# Patient Record
Sex: Female | Born: 1995 | Race: White | Hispanic: No | Marital: Single | Smoking: Current some day smoker
Health system: Southern US, Community
[De-identification: ages and names within clinical notes are randomized; demographics above are authoritative.]

## PROBLEM LIST (undated history)

## (undated) DIAGNOSIS — J302 Other seasonal allergic rhinitis: Secondary | ICD-10-CM

## (undated) DIAGNOSIS — J45909 Unspecified asthma, uncomplicated: Secondary | ICD-10-CM

## (undated) DIAGNOSIS — E282 Polycystic ovarian syndrome: Secondary | ICD-10-CM

## (undated) HISTORY — PX: UPPER GI ENDOSCOPY: SHX6162

## (undated) HISTORY — PX: OTHER SURGICAL HISTORY: SHX169

## (undated) HISTORY — PX: WISDOM TOOTH EXTRACTION: SHX21

---
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
## 2014-04-13 IMAGING — US US ABDOMEN COMPLETE
1 series · 14 of 25 positions shown · non-contrast
Comparison: None.

CLINICAL DATA: Abdominal pain. History of gallbladder sludge.

EXAM:
ABDOMEN ULTRASOUND

[Series 1: us abdomen complete · 0.28mm/px · 14 of 72 slices shown]
[im 1/72]
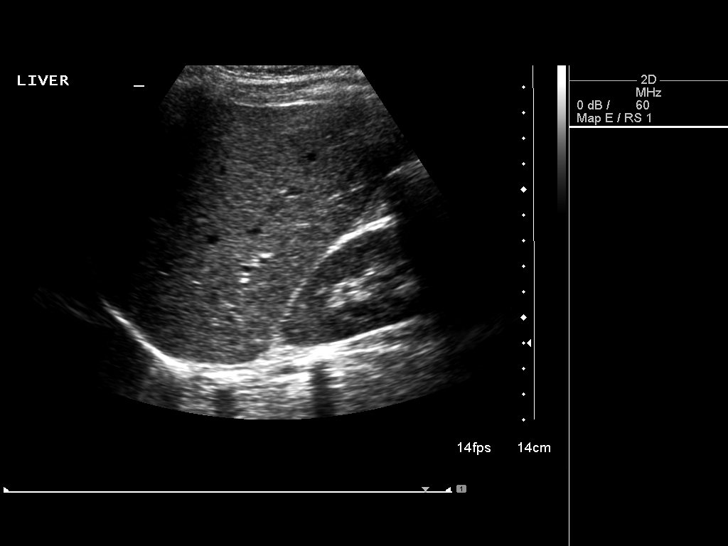
[im 6/72]
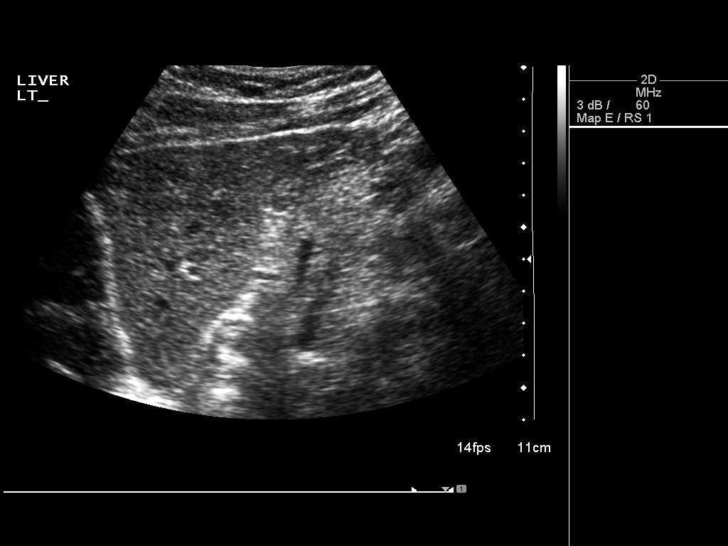
[im 12/72]
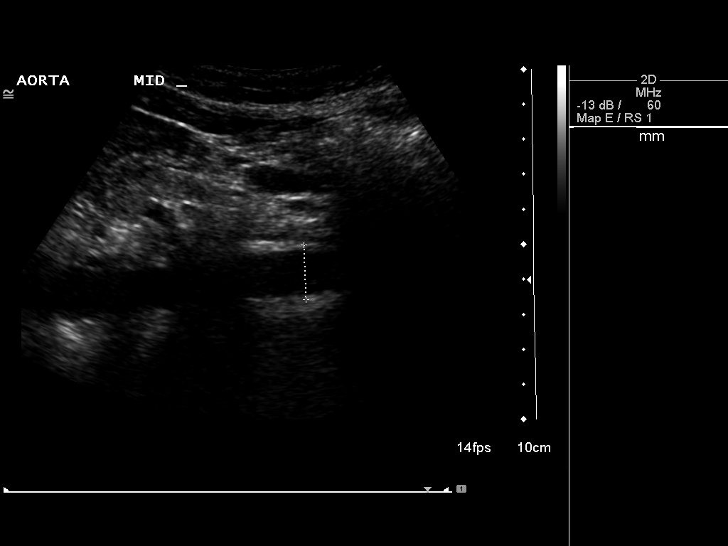
[im 18/72]
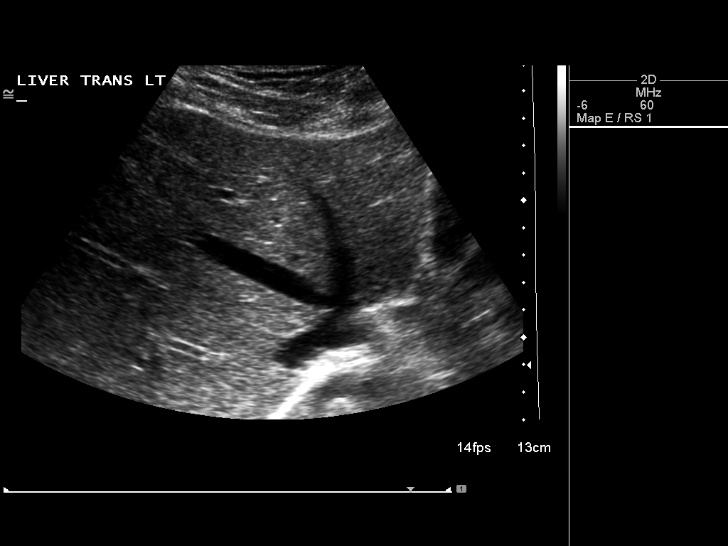
[im 24/72]
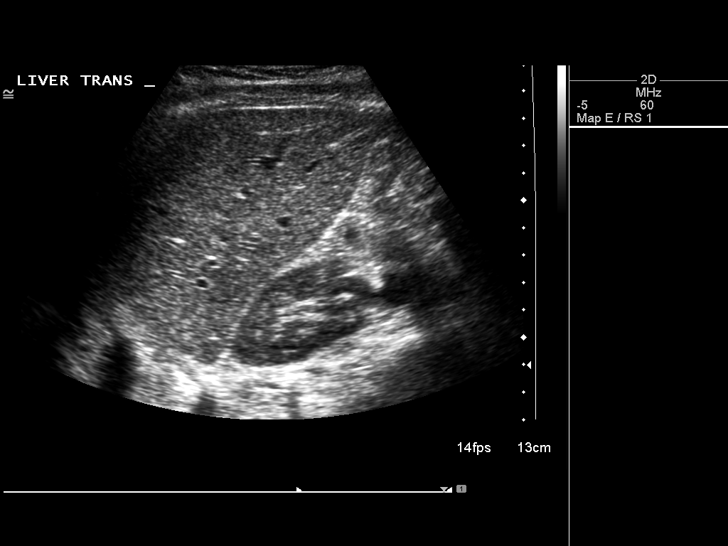
[im 27/72]
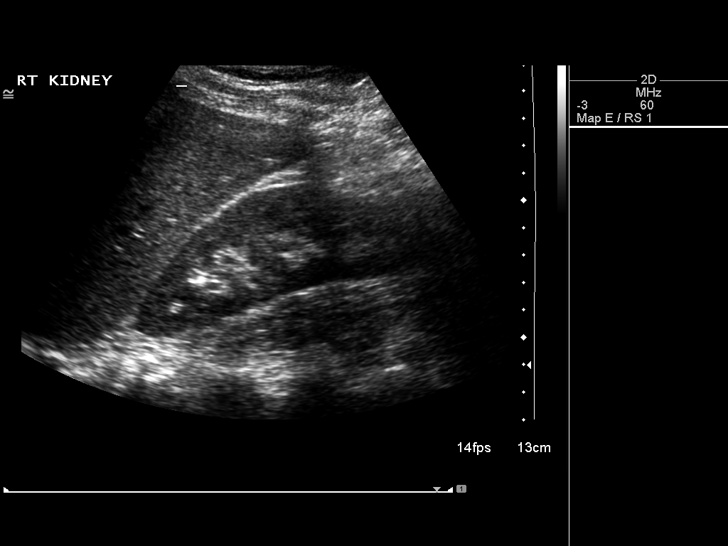
[im 33/72]
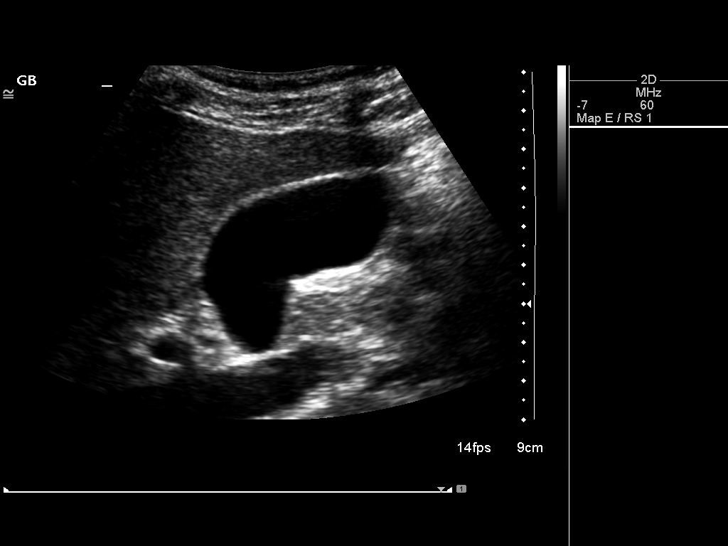
[im 39/72]
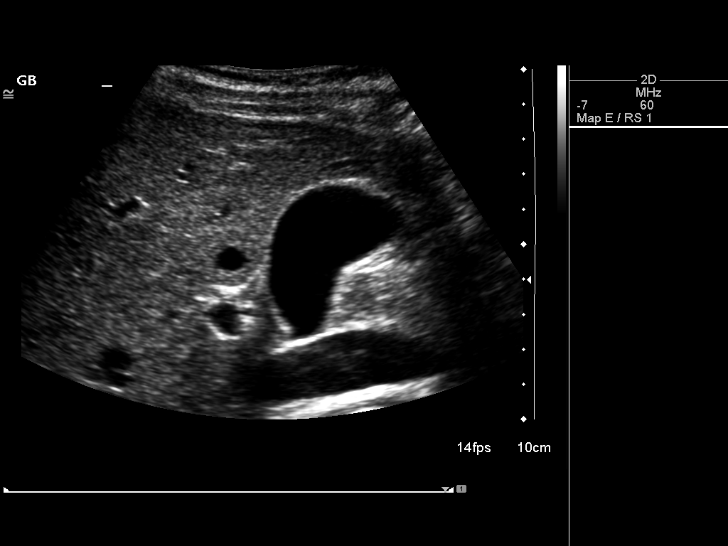
[im 45/72]
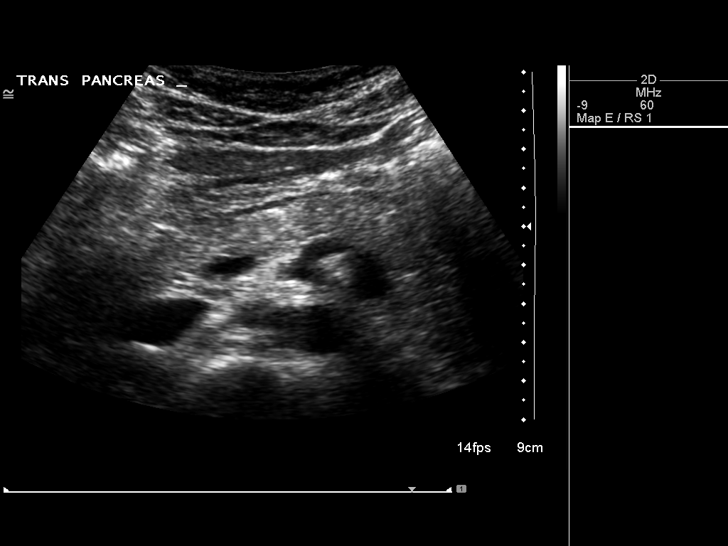
[im 48/72]
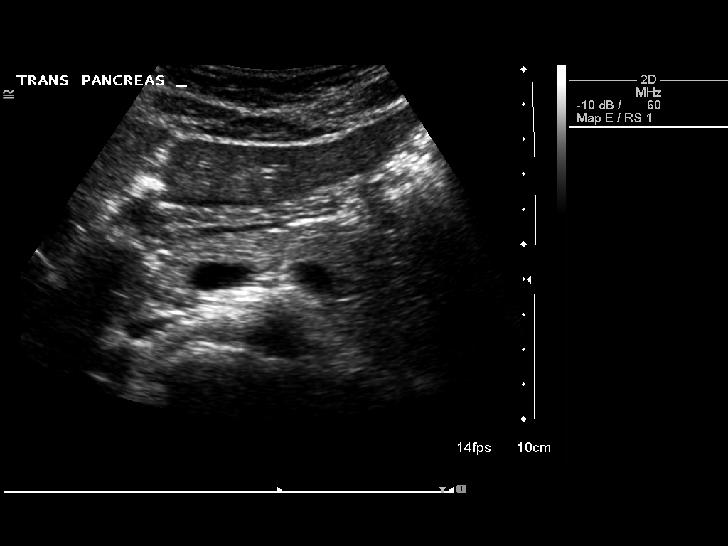
[im 54/72]
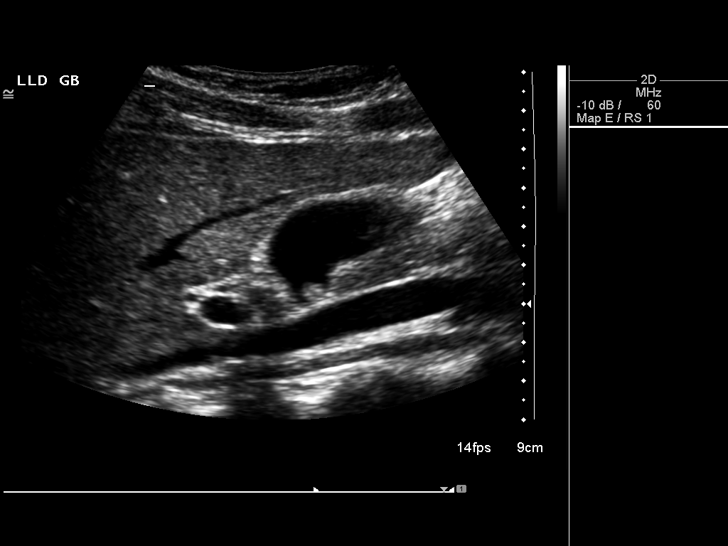
[im 60/72]
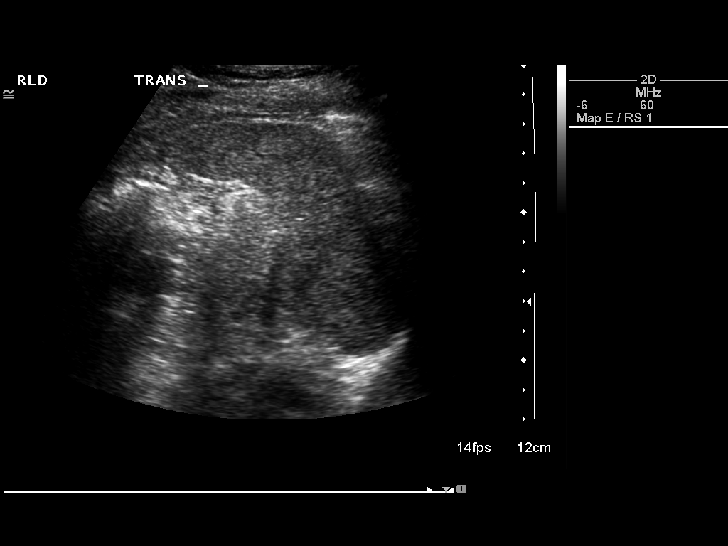
[im 66/72]
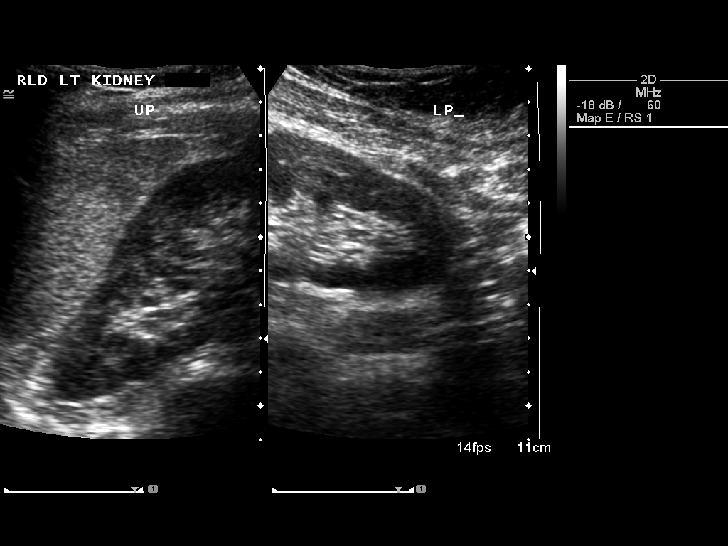
[im 72/72]
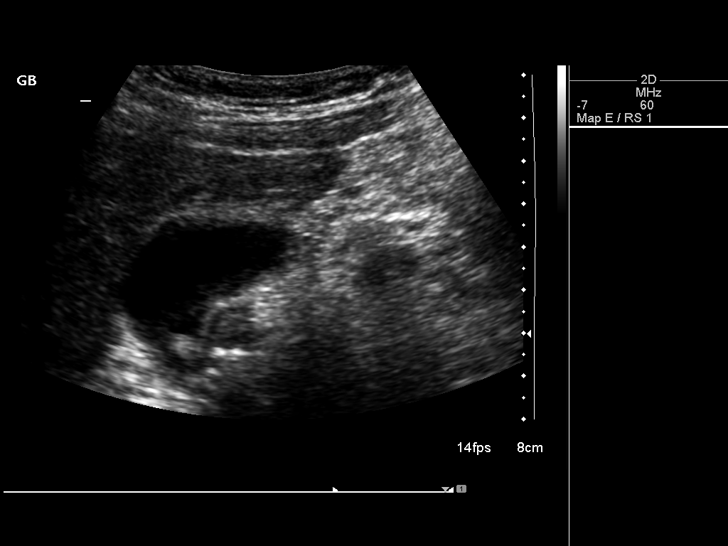

[14 of 25 positions shown; findings below may reference images not displayed]

FINDINGS: Gallbladder

There is a 6.5 mm gallbladder polyp. No gallstone, sludge or wall
thickening is identified. Sonographic Murphy sign is absent.

Common bile duct

Diameter: 2.4 mm. No intraductal calculi.

Liver

No focal lesion identified. Within normal limits in parenchymal
echogenicity.

IVC

No abnormality visualized.

Pancreas

Visualized portion unremarkable.

Spleen

Size and appearance within normal limits.

Right Kidney

Length: 11.7 cm. Echogenicity within normal limits. No mass or
hydronephrosis visualized.

Left Kidney

Length: 12.4 cm. Echogenicity within normal limits. No mass or
hydronephrosis visualized.

Abdominal aorta

No aneurysm visualized.
IMPRESSION: 1. Solitary 7 mm gallbladder polyp. Correlation with prior imaging
studies suggested to assess stability. If prior studies are
unavailable, a limited right upper quadrant ultrasound follow up
examination in 6 months is suggested to assess stability.
2. No gallstones, wall thickening or biliary dilatation.
3. Otherwise unremarkable abdominal ultrasound.

## 2014-09-03 ENCOUNTER — Emergency Department (HOSPITAL_COMMUNITY): Payer: BLUE CROSS/BLUE SHIELD

## 2014-09-03 ENCOUNTER — Encounter (HOSPITAL_COMMUNITY): Payer: Self-pay | Admitting: *Deleted

## 2014-09-03 ENCOUNTER — Emergency Department (HOSPITAL_COMMUNITY)
Admission: EM | Admit: 2014-09-03 | Discharge: 2014-09-03 | Disposition: A | Discharge status: Home / Self Care | Payer: BLUE CROSS/BLUE SHIELD | Attending: Emergency Medicine | Admitting: Emergency Medicine

## 2014-09-03 DIAGNOSIS — S6991XA Unspecified injury of right wrist, hand and finger(s), initial encounter: Secondary | ICD-10-CM | POA: Diagnosis present

## 2014-09-03 DIAGNOSIS — Y998 Other external cause status: Secondary | ICD-10-CM | POA: Diagnosis not present

## 2014-09-03 DIAGNOSIS — W230XXA Caught, crushed, jammed, or pinched between moving objects, initial encounter: Secondary | ICD-10-CM | POA: Insufficient documentation

## 2014-09-03 DIAGNOSIS — S62666A Nondisplaced fracture of distal phalanx of right little finger, initial encounter for closed fracture: Secondary | ICD-10-CM | POA: Diagnosis not present

## 2014-09-03 DIAGNOSIS — Y9389 Activity, other specified: Secondary | ICD-10-CM | POA: Diagnosis not present

## 2014-09-03 DIAGNOSIS — Z8639 Personal history of other endocrine, nutritional and metabolic disease: Secondary | ICD-10-CM | POA: Diagnosis not present

## 2014-09-03 DIAGNOSIS — Y9289 Other specified places as the place of occurrence of the external cause: Secondary | ICD-10-CM | POA: Insufficient documentation

## 2014-09-03 DIAGNOSIS — S62609A Fracture of unspecified phalanx of unspecified finger, initial encounter for closed fracture: Secondary | ICD-10-CM

## 2014-09-03 DIAGNOSIS — J45909 Unspecified asthma, uncomplicated: Secondary | ICD-10-CM | POA: Diagnosis not present

## 2014-09-03 HISTORY — DX: Polycystic ovarian syndrome: E28.2

## 2014-09-03 HISTORY — DX: Other seasonal allergic rhinitis: J30.2

## 2014-09-03 HISTORY — DX: Unspecified asthma, uncomplicated: J45.909

## 2014-09-03 MED ORDER — TRAMADOL HCL 50 MG PO TABS: 50.0000 mg | ORAL_TABLET | Freq: Four times a day (QID) | ORAL | Status: AC | PRN

## 2014-09-03 NOTE — ED Provider Notes (Signed)
CSN: [id number]     Arrival date & time 09/03/14  1731 History  This chart was scribed for non-physician practitioner, Langston Masker, PA-C, working with Tilden Fossa, MD, by Ronney Lion, ED Scribe. This patient was seen in room TR08C/TR08C and the patient's care was started at 6:18 PM.    Chief Complaint  Patient presents with  . Finger Injury   The history is provided by the patient. No language interpreter was used.    HPI Comments: Caroline Wright is a 19 y.o. female who presents to the Emergency Department complaining of right pinky pain, bleeding, and swelling after she closed her finger in a car door earlier today. She states immediately after injuring it, her pinky turned dark in color and began to swell. She states she think it may be broken. Patient denies a history of chronic medical conditions. She has known allergies to Lortab.   Past Medical History  Diagnosis Date  . Asthma   . Polycystic ovarian syndrome   . Seasonal allergies    Past Surgical History  Procedure Laterality Date  . Wisdom tooth extraction    . Upper gi endoscopy    . Colonscopy     No family history on file. History  Substance Use Topics  . Smoking status: Not on file  . Smokeless tobacco: Not on file  . Alcohol Use: Not on file   OB History    No data available     Review of Systems  Musculoskeletal: Positive for myalgias (right pinky pain and swelling).  Skin: Positive for color change and wound.  All other systems reviewed and are negative.  Allergies  Lortab  Home Medications   Prior to Admission medications   Not on File   BP 121/69 mmHg  Pulse 63  Temp(Src) 98.2 F (36.8 C) (Oral)  Resp 16  Ht  (1.626 m)  Wt 139 lb (63.05 kg)  BMI 23.85 kg/m2  SpO2 100% Physical Exam  Constitutional: She is oriented to person, place, and time. She appears well-developed and well-nourished. No distress.  HENT:  Head: Normocephalic and atraumatic.  Eyes: Conjunctivae and EOM are normal.   Neck: Neck supple. No tracheal deviation present.  Cardiovascular: Normal rate.   Pulmonary/Chest: Effort normal. No respiratory distress.  Musculoskeletal: Normal range of motion.  Neurological: She is alert and oriented to person, place, and time.  Skin: Skin is warm and dry.  Swollen, bruised distal right fifth finger with slight abrasion at the base of the nail.  Psychiatric: She has a normal mood and affect. Her behavior is normal.  Nursing note and vitals reviewed.   ED Course  Procedures (including critical care time)  DIAGNOSTIC STUDIES: Oxygen Saturation is 100% on RA, normal by my interpretation.    COORDINATION OF CARE: 6:22 PM - Discussed treatment plan with pt at bedside which includes right hand XR to r/o fracture, and pt agreed to plan.  Imaging Review Dg Finger Little Right  09/03/2014   CLINICAL DATA:  Car door injury to right little finger today with pain and swelling. Initial encounter.  EXAM: RIGHT LITTLE FINGER 2+V  COMPARISON:  None.  FINDINGS: A nondisplaced oblique fracture of the mid-distal aspect of the little finger distal phalanx is noted.  There is no evidence of subluxation or dislocation.  Associated soft tissue swelling is noted.  IMPRESSION: Nondisplaced oblique fracture of the mid-distal aspect of the little finger distal phalanx.   Electronically Signed   By: Henrietta Hoover.D.  On: 09/03/2014 18:34   7:08 PM - XR results reviewed with pt. Discussed treatment plan with pt which includes placement in a finger splint, Rx pain medications as needed, and f/u in 1 week with referred hand specialist. Pt verbalized understanding and agreed to plan.    MDM   Final diagnoses:  Fracture of phalanx of finger, closed, initial encounter    Finger splint Tramadol Follow up with Hand surgery for recheck in 1 week Janee Morn  I personally performed the services in this documentation, which was scribed in my presence.  The recorded information has been reviewed  and considered.   Barnet Pall.   Elson Areas, PA-C 09/03/14 2003  Tilden Fossa, MD 09/04/14 [phone]

## 2014-09-03 NOTE — Discharge Instructions (Signed)

## 2014-09-03 NOTE — ED Notes (Signed)
Pt presents via POV after a right pinky finger injury.  Pt closed finger in the car door.  Small amount of blood noted, small cut noted, bleeding controlled.  Pt able to move finger, sensation intact.  Pt a x 4, NAD.

## 2016-02-21 IMAGING — DX DG FINGER LITTLE 2+V*R*
3 series · 3 of 3 positions shown · non-contrast
Comparison: None.

CLINICAL DATA: Car door injury to right little finger today with
pain and swelling. Initial encounter.

EXAM:
RIGHT LITTLE FINGER 2+V

[finger ap]
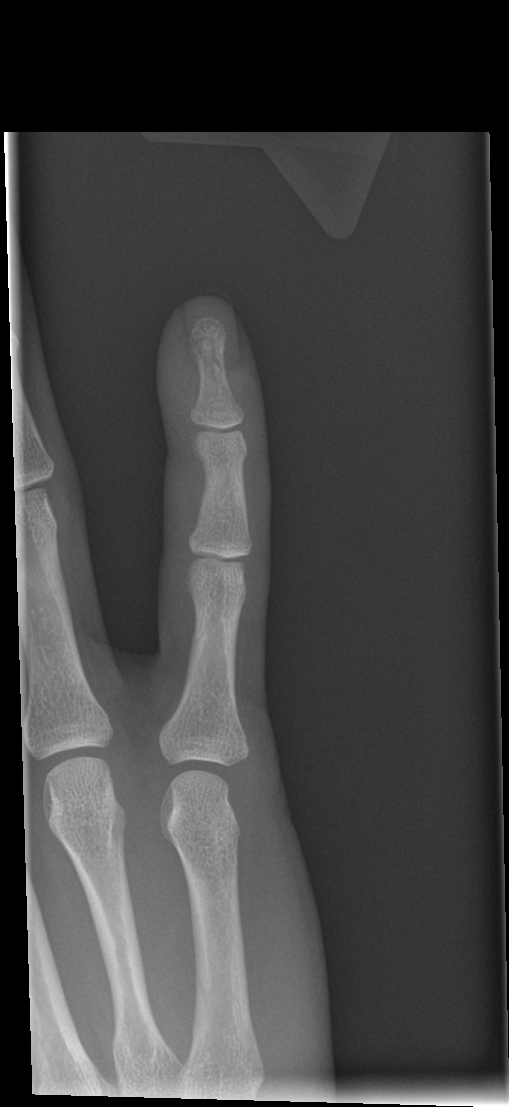

[finger obl]
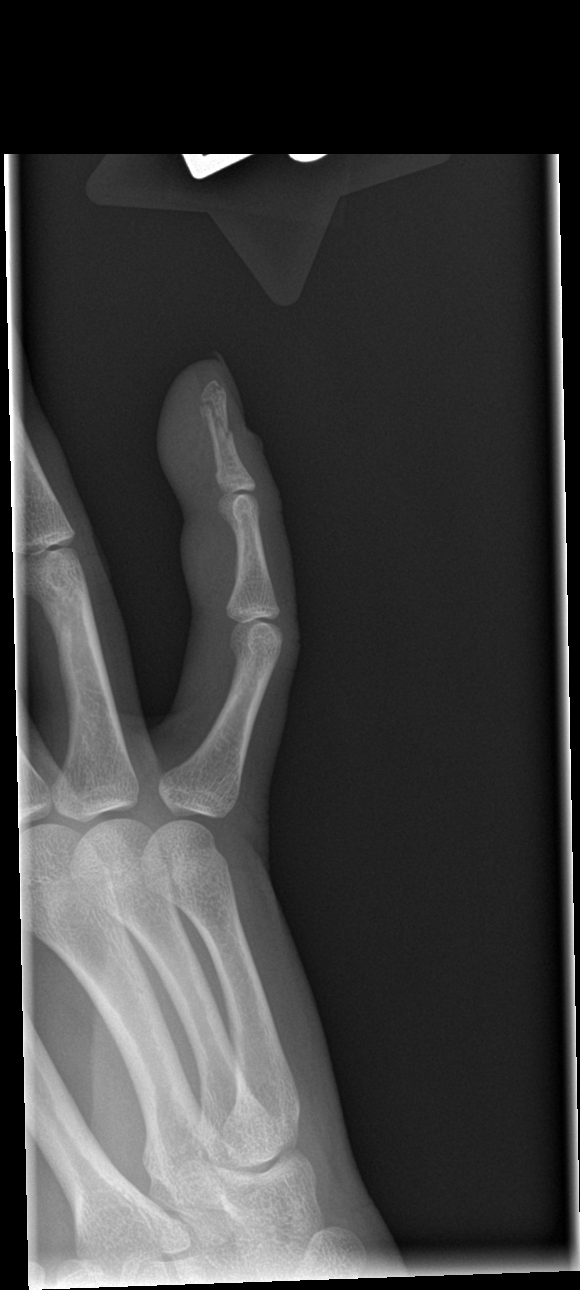

[finger lat]
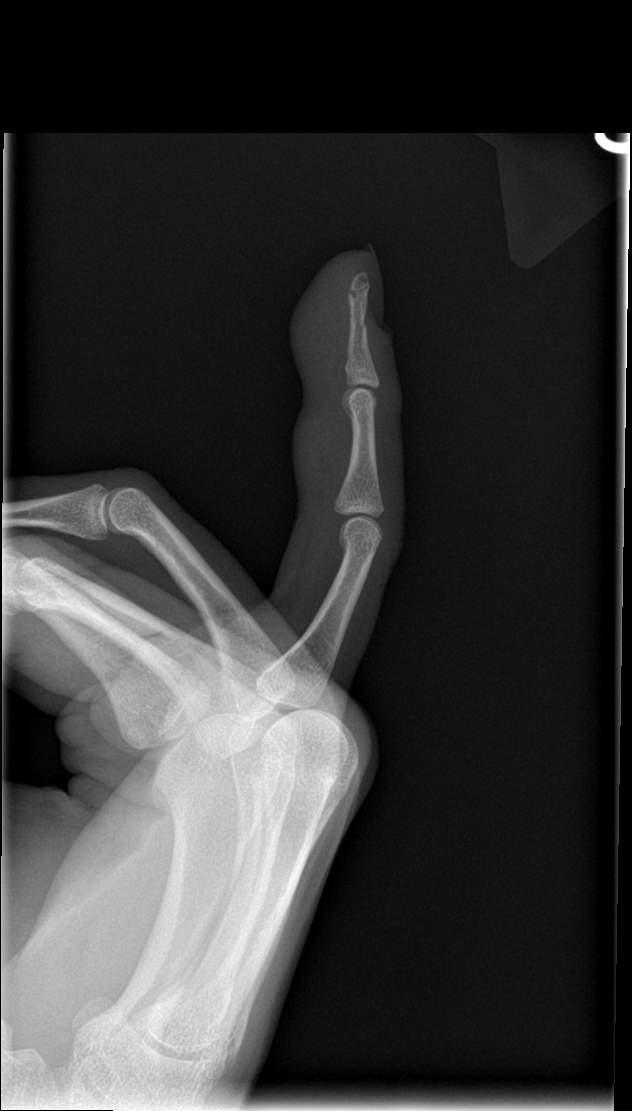

[3 of 3 positions shown; findings below may reference images not displayed]

FINDINGS: A nondisplaced oblique fracture of the mid-distal aspect of the
little finger distal phalanx is noted.

There is no evidence of subluxation or dislocation.

Associated soft tissue swelling is noted.
IMPRESSION: Nondisplaced oblique fracture of the mid-distal aspect of the little
finger distal phalanx.
